# Patient Record
Sex: Female | Born: 1960 | Race: Black or African American | Hispanic: No
Health system: Southern US, Community
[De-identification: ages and names within clinical notes are randomized; demographics above are authoritative.]

---

## 1998-06-18 ENCOUNTER — Ambulatory Visit (HOSPITAL_COMMUNITY): Admission: RE | Admit: 1998-06-18 | Discharge: 1998-06-18 | Payer: Self-pay | Admitting: Obstetrics and Gynecology

## 2001-06-20 ENCOUNTER — Other Ambulatory Visit: Admission: RE | Admit: 2001-06-20 | Discharge: 2001-06-20 | Payer: Self-pay | Admitting: Obstetrics and Gynecology

## 2002-07-18 ENCOUNTER — Other Ambulatory Visit: Admission: RE | Admit: 2002-07-18 | Discharge: 2002-07-18 | Payer: Self-pay | Admitting: Obstetrics and Gynecology

## 2002-08-15 ENCOUNTER — Encounter: Payer: Self-pay | Admitting: Obstetrics and Gynecology

## 2002-08-15 ENCOUNTER — Encounter: Admission: RE | Admit: 2002-08-15 | Discharge: 2002-08-15 | Payer: Self-pay | Admitting: Obstetrics and Gynecology

## 2003-03-04 ENCOUNTER — Emergency Department (HOSPITAL_COMMUNITY): Admission: EM | Admit: 2003-03-04 | Discharge: 2003-03-04 | Payer: Self-pay | Admitting: *Deleted

## 2003-07-21 ENCOUNTER — Other Ambulatory Visit: Admission: RE | Admit: 2003-07-21 | Discharge: 2003-07-21 | Payer: Self-pay | Admitting: Obstetrics and Gynecology

## 2004-04-16 ENCOUNTER — Encounter: Admission: RE | Admit: 2004-04-16 | Discharge: 2004-04-16 | Payer: Self-pay | Admitting: Obstetrics and Gynecology

## 2005-05-04 ENCOUNTER — Encounter: Admission: RE | Admit: 2005-05-04 | Discharge: 2005-05-04 | Payer: Self-pay | Admitting: Obstetrics and Gynecology

## 2006-05-18 ENCOUNTER — Encounter (INDEPENDENT_AMBULATORY_CARE_PROVIDER_SITE_OTHER): Payer: Self-pay | Admitting: Specialist

## 2006-05-18 ENCOUNTER — Ambulatory Visit (HOSPITAL_COMMUNITY): Admission: RE | Admit: 2006-05-18 | Discharge: 2006-05-18 | Payer: Self-pay | Admitting: Obstetrics and Gynecology

## 2006-06-14 ENCOUNTER — Encounter: Admission: RE | Admit: 2006-06-14 | Discharge: 2006-06-14 | Payer: Self-pay | Admitting: Obstetrics and Gynecology

## 2006-06-21 ENCOUNTER — Encounter: Admission: RE | Admit: 2006-06-21 | Discharge: 2006-06-21 | Payer: Self-pay | Admitting: Obstetrics and Gynecology

## 2006-11-30 ENCOUNTER — Encounter: Admission: RE | Admit: 2006-11-30 | Discharge: 2006-11-30 | Payer: Self-pay | Admitting: Obstetrics and Gynecology

## 2008-02-28 ENCOUNTER — Other Ambulatory Visit: Admission: RE | Admit: 2008-02-28 | Discharge: 2008-02-28 | Payer: Self-pay | Admitting: Family Medicine

## 2008-07-16 ENCOUNTER — Encounter: Admission: RE | Admit: 2008-07-16 | Discharge: 2008-07-16 | Payer: Self-pay | Admitting: Family Medicine

## 2008-12-25 ENCOUNTER — Ambulatory Visit: Payer: Self-pay | Admitting: Gynecology

## 2009-03-06 ENCOUNTER — Other Ambulatory Visit: Admission: RE | Admit: 2009-03-06 | Discharge: 2009-03-06 | Payer: Self-pay | Admitting: Family Medicine

## 2010-01-07 ENCOUNTER — Encounter: Admission: RE | Admit: 2010-01-07 | Discharge: 2010-01-07 | Payer: Self-pay | Admitting: Family Medicine

## 2010-10-17 ENCOUNTER — Encounter: Payer: Self-pay | Admitting: Family Medicine

## 2010-10-17 ENCOUNTER — Encounter: Payer: Self-pay | Admitting: Obstetrics and Gynecology

## 2011-02-11 NOTE — Op Note (Signed)
Chelsea Wong, GOODCHILD             ACCOUNT NO.:  [id number]   MEDICAL RECORD NO.:  [id number]          PATIENT TYPE:  AMB   LOCATION:  SDC                           FACILITY:  [facility]   PHYSICIAN:  Lenoard Aden, M.D.DATE OF BIRTH:  1961/06/10   DATE OF PROCEDURE:  05/18/2006  DATE OF DISCHARGE:                                 OPERATIVE REPORT   PREOPERATIVE DIAGNOSIS:  Dysfunctional uterine bleeding with submucous  fibroid.   POSTOPERATIVE DIAGNOSIS:  Dysfunctional uterine bleeding with submucous  fibroid.   PROCEDURES:  1. Diagnostic hysteroscopy.  2. Resectoscopic myomectomy.  3. D&C.   SURGEON:  Dr. Billy Coast.   ANESTHESIA:  General.   ESTIMATED BLOOD LOSS:  Less than 50 cc.   FLUID DEFICIT:  300 cc.   COMPLICATIONS:  None.   DRAINS:  None.   COUNTS:  Correct.   Patient recovered in good condition.   BRIEF OPERATIVE NOTE:  After being apprised of the risks of anesthesia,  infection, bleeding, injury to bowel and organs, need for repair, uterine  perforation with possible need for repair, the patient was brought to the  operating room where she was administered anesthetic without complications,  prepped and draped in the usual sterile fashion, catheterized until the  bladder was emptied.  Examination under anesthesia reveals an anteflexed  uterus and no adnexal masses.  Dilute Pitressin and Nesacaine solutions are  placed in standard fashion.  At this time the cervix is easily dilated up to  a #27 Pratt dilator, hysteroscope placed.  Visualization reveals anterior  wall fibroid which was  resected in multiple passes using a single loop.  Good hemostasis was noted.  D&C is performed.  Posterior wall resection and left lateral wall resection  are performed without difficulty.  Good hemostasis is noted.  Instruments  are removed.  Pictures are taken.  Patient tolerated the procedure well and  was transferred to recovery in good condition.      Lenoard Aden,  M.D.  Electronically Signed     RJT/MEDQ  D:  05/18/2006  T:  05/18/2006  Job:  [id number]   cc:   Lenoard Aden, M.D.  Fax: [phone]

## 2011-02-14 ENCOUNTER — Other Ambulatory Visit: Payer: Self-pay | Admitting: Family Medicine

## 2011-02-14 DIAGNOSIS — Z1231 Encounter for screening mammogram for malignant neoplasm of breast: Secondary | ICD-10-CM

## 2011-02-23 ENCOUNTER — Ambulatory Visit: Payer: Self-pay

## 2011-03-01 ENCOUNTER — Ambulatory Visit
Admission: RE | Admit: 2011-03-01 | Discharge: 2011-03-01 | Disposition: A | Discharge status: Home / Self Care | Payer: 59 | Source: Ambulatory Visit | Attending: Family Medicine | Admitting: Family Medicine

## 2011-03-01 DIAGNOSIS — Z1231 Encounter for screening mammogram for malignant neoplasm of breast: Secondary | ICD-10-CM

## 2012-02-21 ENCOUNTER — Other Ambulatory Visit: Payer: Self-pay | Admitting: Family Medicine

## 2012-02-21 DIAGNOSIS — Z1231 Encounter for screening mammogram for malignant neoplasm of breast: Secondary | ICD-10-CM

## 2012-03-02 ENCOUNTER — Ambulatory Visit
Admission: RE | Admit: 2012-03-02 | Discharge: 2012-03-02 | Disposition: A | Discharge status: Home / Self Care | Payer: 59 | Source: Ambulatory Visit | Attending: Family Medicine | Admitting: Family Medicine

## 2012-03-02 ENCOUNTER — Ambulatory Visit: Payer: 59

## 2012-03-02 DIAGNOSIS — Z1231 Encounter for screening mammogram for malignant neoplasm of breast: Secondary | ICD-10-CM

## 2012-05-04 ENCOUNTER — Other Ambulatory Visit (HOSPITAL_COMMUNITY)
Admission: RE | Admit: 2012-05-04 | Discharge: 2012-05-04 | Disposition: A | Discharge status: Home / Self Care | Payer: 59 | Source: Ambulatory Visit | Attending: Family Medicine | Admitting: Family Medicine

## 2012-05-04 ENCOUNTER — Other Ambulatory Visit: Payer: Self-pay | Admitting: Physician Assistant

## 2012-05-04 DIAGNOSIS — Z124 Encounter for screening for malignant neoplasm of cervix: Secondary | ICD-10-CM | POA: Insufficient documentation

## 2013-02-21 ENCOUNTER — Other Ambulatory Visit: Payer: Self-pay

## 2013-02-21 DIAGNOSIS — Z1231 Encounter for screening mammogram for malignant neoplasm of breast: Secondary | ICD-10-CM

## 2013-03-19 ENCOUNTER — Ambulatory Visit
Admission: RE | Admit: 2013-03-19 | Discharge: 2013-03-19 | Disposition: A | Discharge status: Home / Self Care | Payer: 59 | Source: Ambulatory Visit

## 2013-03-19 DIAGNOSIS — Z1231 Encounter for screening mammogram for malignant neoplasm of breast: Secondary | ICD-10-CM

## 2013-05-10 ENCOUNTER — Other Ambulatory Visit: Payer: Self-pay | Admitting: Physician Assistant

## 2013-05-10 ENCOUNTER — Other Ambulatory Visit (HOSPITAL_COMMUNITY)
Admission: RE | Admit: 2013-05-10 | Discharge: 2013-05-10 | Disposition: A | Discharge status: Home / Self Care | Payer: 59 | Source: Ambulatory Visit | Attending: Family Medicine | Admitting: Family Medicine

## 2013-05-10 DIAGNOSIS — Z01419 Encounter for gynecological examination (general) (routine) without abnormal findings: Secondary | ICD-10-CM | POA: Insufficient documentation

## 2013-06-07 ENCOUNTER — Other Ambulatory Visit: Payer: Self-pay | Admitting: Gastroenterology

## 2014-02-28 ENCOUNTER — Other Ambulatory Visit: Payer: Self-pay

## 2014-02-28 DIAGNOSIS — Z1231 Encounter for screening mammogram for malignant neoplasm of breast: Secondary | ICD-10-CM

## 2014-04-01 ENCOUNTER — Ambulatory Visit
Admission: RE | Admit: 2014-04-01 | Discharge: 2014-04-01 | Disposition: A | Discharge status: Home / Self Care | Payer: 59 | Source: Ambulatory Visit

## 2014-04-01 ENCOUNTER — Encounter (INDEPENDENT_AMBULATORY_CARE_PROVIDER_SITE_OTHER): Payer: Self-pay

## 2014-04-01 DIAGNOSIS — Z1231 Encounter for screening mammogram for malignant neoplasm of breast: Secondary | ICD-10-CM

## 2014-04-03 ENCOUNTER — Other Ambulatory Visit: Payer: Self-pay | Admitting: Family Medicine

## 2014-04-03 DIAGNOSIS — R928 Other abnormal and inconclusive findings on diagnostic imaging of breast: Secondary | ICD-10-CM

## 2014-04-11 ENCOUNTER — Ambulatory Visit
Admission: RE | Admit: 2014-04-11 | Discharge: 2014-04-11 | Disposition: A | Discharge status: Home / Self Care | Payer: 59 | Source: Ambulatory Visit | Attending: Family Medicine | Admitting: Family Medicine

## 2014-04-11 DIAGNOSIS — R928 Other abnormal and inconclusive findings on diagnostic imaging of breast: Secondary | ICD-10-CM

## 2014-11-05 ENCOUNTER — Other Ambulatory Visit (HOSPITAL_COMMUNITY)
Admission: RE | Admit: 2014-11-05 | Discharge: 2014-11-05 | Disposition: A | Discharge status: Home / Self Care | Payer: 59 | Source: Ambulatory Visit | Attending: Family Medicine | Admitting: Family Medicine

## 2014-11-05 ENCOUNTER — Other Ambulatory Visit: Payer: Self-pay | Admitting: Physician Assistant

## 2014-11-05 DIAGNOSIS — Z124 Encounter for screening for malignant neoplasm of cervix: Secondary | ICD-10-CM | POA: Diagnosis present

## 2014-11-07 LAB — CYTOLOGY - PAP

## 2016-04-26 ENCOUNTER — Other Ambulatory Visit: Payer: Self-pay | Admitting: Physician Assistant

## 2016-04-26 ENCOUNTER — Ambulatory Visit
Admission: RE | Admit: 2016-04-26 | Discharge: 2016-04-26 | Disposition: A | Discharge status: Home / Self Care | Payer: 59 | Source: Ambulatory Visit | Attending: Physician Assistant | Admitting: Physician Assistant

## 2016-04-26 DIAGNOSIS — R52 Pain, unspecified: Secondary | ICD-10-CM

## 2016-11-09 ENCOUNTER — Other Ambulatory Visit: Payer: Self-pay | Admitting: Physician Assistant

## 2016-11-09 DIAGNOSIS — R921 Mammographic calcification found on diagnostic imaging of breast: Secondary | ICD-10-CM

## 2016-11-21 ENCOUNTER — Ambulatory Visit
Admission: RE | Admit: 2016-11-21 | Discharge: 2016-11-21 | Disposition: A | Discharge status: Home / Self Care | Payer: 59 | Source: Ambulatory Visit | Attending: Physician Assistant | Admitting: Physician Assistant

## 2016-11-21 ENCOUNTER — Encounter: Payer: Self-pay | Admitting: Radiology

## 2016-11-21 ENCOUNTER — Other Ambulatory Visit: Payer: Self-pay | Admitting: Physician Assistant

## 2016-11-21 DIAGNOSIS — R921 Mammographic calcification found on diagnostic imaging of breast: Secondary | ICD-10-CM

## 2016-11-22 ENCOUNTER — Ambulatory Visit
Admission: RE | Admit: 2016-11-22 | Discharge: 2016-11-22 | Disposition: A | Discharge status: Home / Self Care | Payer: 59 | Source: Ambulatory Visit | Attending: Physician Assistant | Admitting: Physician Assistant

## 2016-11-22 DIAGNOSIS — R921 Mammographic calcification found on diagnostic imaging of breast: Secondary | ICD-10-CM

## 2017-10-13 ENCOUNTER — Other Ambulatory Visit: Payer: Self-pay | Admitting: Physician Assistant

## 2017-10-13 DIAGNOSIS — Z1231 Encounter for screening mammogram for malignant neoplasm of breast: Secondary | ICD-10-CM

## 2017-11-10 ENCOUNTER — Other Ambulatory Visit (HOSPITAL_COMMUNITY)
Admission: RE | Admit: 2017-11-10 | Discharge: 2017-11-10 | Disposition: A | Discharge status: Home / Self Care | Payer: 59 | Source: Ambulatory Visit | Attending: Family Medicine | Admitting: Family Medicine

## 2017-11-10 ENCOUNTER — Other Ambulatory Visit: Payer: Self-pay | Admitting: Physician Assistant

## 2017-11-10 DIAGNOSIS — Z01419 Encounter for gynecological examination (general) (routine) without abnormal findings: Secondary | ICD-10-CM | POA: Diagnosis present

## 2017-11-14 LAB — CYTOLOGY - PAP: DIAGNOSIS: NEGATIVE

## 2017-11-24 ENCOUNTER — Ambulatory Visit
Admission: RE | Admit: 2017-11-24 | Discharge: 2017-11-24 | Disposition: A | Discharge status: Home / Self Care | Payer: 59 | Source: Ambulatory Visit | Attending: Physician Assistant | Admitting: Physician Assistant

## 2017-11-24 DIAGNOSIS — Z1231 Encounter for screening mammogram for malignant neoplasm of breast: Secondary | ICD-10-CM

## 2019-07-11 ENCOUNTER — Emergency Department (HOSPITAL_COMMUNITY)
Admission: EM | Admit: 2019-07-11 | Discharge: 2019-07-11 | Disposition: A | Discharge status: Home / Self Care | Payer: 59 | Attending: Emergency Medicine | Admitting: Emergency Medicine

## 2019-07-11 ENCOUNTER — Other Ambulatory Visit: Payer: Self-pay

## 2019-07-11 ENCOUNTER — Emergency Department (HOSPITAL_COMMUNITY): Payer: 59

## 2019-07-11 DIAGNOSIS — R0602 Shortness of breath: Secondary | ICD-10-CM | POA: Diagnosis present

## 2019-07-11 DIAGNOSIS — U071 COVID-19: Secondary | ICD-10-CM | POA: Diagnosis not present

## 2019-07-11 DIAGNOSIS — Z20822 Contact with and (suspected) exposure to covid-19: Secondary | ICD-10-CM

## 2019-07-11 LAB — CBC WITH DIFFERENTIAL/PLATELET
Abs Immature Granulocytes: 0.02 K/uL (ref 0.00–0.07)
Basophils Absolute: 0 K/uL (ref 0.0–0.1)
Basophils Relative: 0 %
Eosinophils Absolute: 0 K/uL (ref 0.0–0.5)
Eosinophils Relative: 0 %
HCT: 44 % (ref 36.0–46.0)
Hemoglobin: 14.6 g/dL (ref 12.0–15.0)
Immature Granulocytes: 0 %
Lymphocytes Relative: 24 %
Lymphs Abs: 1.2 K/uL (ref 0.7–4.0)
MCH: 28.5 pg (ref 26.0–34.0)
MCHC: 33.2 g/dL (ref 30.0–36.0)
MCV: 85.9 fL (ref 80.0–100.0)
Monocytes Absolute: 0.4 K/uL (ref 0.1–1.0)
Monocytes Relative: 9 %
Neutro Abs: 3.4 K/uL (ref 1.7–7.7)
Neutrophils Relative %: 67 %
Platelets: 121 K/uL — ABNORMAL LOW (ref 150–400)
RBC: 5.12 MIL/uL — ABNORMAL HIGH (ref 3.87–5.11)
RDW: 14.2 % (ref 11.5–15.5)
WBC: 5.1 K/uL (ref 4.0–10.5)
nRBC: 0 % (ref 0.0–0.2)

## 2019-07-11 LAB — COMPREHENSIVE METABOLIC PANEL
ALT: 25 U/L (ref 0–44)
AST: 58 U/L — ABNORMAL HIGH (ref 15–41)
Albumin: 3.7 g/dL (ref 3.5–5.0)
Alkaline Phosphatase: 70 U/L (ref 38–126)
Anion gap: 13 (ref 5–15)
BUN: 13 mg/dL (ref 6–20)
CO2: 22 mmol/L (ref 22–32)
Calcium: 8.5 mg/dL — ABNORMAL LOW (ref 8.9–10.3)
Chloride: 101 mmol/L (ref 98–111)
Creatinine, Ser: 0.95 mg/dL (ref 0.44–1.00)
GFR calc Af Amer: >60 mL/min (ref >60)
GFR calc non Af Amer: >60 mL/min (ref >60)
Glucose, Bld: 111 mg/dL — ABNORMAL HIGH (ref 70–99)
Potassium: 4.8 mmol/L (ref 3.5–5.1)
Sodium: 136 mmol/L (ref 135–145)
Total Bilirubin: 1.9 mg/dL — ABNORMAL HIGH (ref 0.3–1.2)
Total Protein: 7.2 g/dL (ref 6.5–8.1)

## 2019-07-11 LAB — LIPASE, BLOOD: Lipase: 34 U/L (ref 11–51)

## 2019-07-11 MED ORDER — FAMOTIDINE IN NACL 20-0.9 MG/50ML-% IV SOLN
20.0000 mg | Freq: Once | INTRAVENOUS | Status: AC
  Administered 2019-07-11: 20 mg via INTRAVENOUS
  Filled 2019-07-11: qty 50

## 2019-07-11 MED ORDER — ONDANSETRON HCL 4 MG PO TABS: 4.0000 mg | ORAL_TABLET | Freq: Four times a day (QID) | ORAL | 0 refills | Status: AC

## 2019-07-11 MED ORDER — ACETAMINOPHEN 500 MG PO TABS
1000.0000 mg | ORAL_TABLET | Freq: Once | ORAL | Status: AC
  Administered 2019-07-11: 12:00:00 1000 mg via ORAL
  Filled 2019-07-11: qty 2

## 2019-07-11 MED ORDER — SODIUM CHLORIDE 0.9 % IV BOLUS
1000.0000 mL | Freq: Once | INTRAVENOUS | Status: AC
  Administered 2019-07-11: 1000 mL via INTRAVENOUS

## 2019-07-11 MED ORDER — ALUM & MAG HYDROXIDE-SIMETH 200-200-20 MG/5ML PO SUSP
30.0000 mL | Freq: Once | ORAL | Status: AC
  Administered 2019-07-11: 30 mL via ORAL
  Filled 2019-07-11: qty 30

## 2019-07-11 MED ORDER — METOCLOPRAMIDE HCL 5 MG/ML IJ SOLN
10.0000 mg | Freq: Once | INTRAMUSCULAR | Status: AC
  Administered 2019-07-11: 10 mg via INTRAVENOUS
  Filled 2019-07-11: qty 2

## 2019-07-11 NOTE — Discharge Instructions (Addendum)
You should rotate tylenol and motrin to treat your fevers and body aches. Make sure to stay well hydrated. You were given a prescription for zofran. Please take as directed.   Today you were were tested for the coronavirus.  The results will be available in the next 3 to 5 days.  If the results are positive the hospital will contact you.  If they are negative the hospital would not contact you.  You will need to self quarantine until you are aware of your results.  If they are positive you will need to self quarantine as directed below.  You should be isolated for at least 7 days since the onset of your symptoms AND >72 hours after symptoms resolution (absence of fever without the use of fever reducing medication and improvement in respiratory symptoms), whichever is longer  Please follow up with your primary care provider within 5-7 days for re-evaluation of your symptoms. If you do not have a primary care provider, information for a healthcare clinic has been provided for you to make arrangements for follow up care. Please return to the emergency department for any new or worsening symptoms.

## 2019-07-11 NOTE — ED Notes (Signed)
Pt given sprite and crackers per fluid challenge order. Will monitor for nausea and vomiting.

## 2019-07-11 NOTE — ED Triage Notes (Signed)
PER EMS: pt presents with c/o SOB, fever, chills, N/V/D, loss of taste and smell onset 6 days ago. She reports possible covid exposure. A&OX4. Initial RA sats with EMS was 96% but placed on 2L [place]. BP- 142/98, HR-88, 97% RA, RR-20, CBG-116

## 2019-07-11 NOTE — ED Notes (Signed)
Pt A&Ox4, ambulatory at d/c with independent steady gait, NAD. Pt verbalized understanding of d/c instructions and follow up care. Pt reports her husband will be driving her home.

## 2019-07-11 NOTE — ED Provider Notes (Addendum)
[facility] EMERGENCY DEPARTMENT Provider Note   CSN: [id number] Arrival date & time: 07/11/19  1107     History   Chief Complaint Chief Complaint  Patient presents with  . Shortness of Breath  . Covid R/O    HPI AKILAH CURETON is a 58 y.o. female.     HPI   Patient is a 58 year old female with no known past medical history presents to the emergency department today for evaluation of fevers, chills, nausea, vomiting, diarrhea, loss of taste/smell and fatigue for the last 6 days.  She has had no real cough.  She has been getting winded when she tries to walk around.  She denies any significant chest pain.  Denies any exacerbating or alleviating factors.  She is concerned that she has COVID as she was recently around her sister-in-law whose children both tested positive for the coronavirus.  Her test is currently pending at this time but she is now also symptomatic.  No past medical history on file.  There are no active problems to display for this patient.   No past surgical history on file.   OB History   No obstetric history on file.      Home Medications    Prior to Admission medications   Medication Sig Start Date End Date Taking? Authorizing Provider  ondansetron (ZOFRAN) 4 MG tablet Take 1 tablet (4 mg total) by mouth every 6 (six) hours. 07/11/19   Zaivion Kundrat S, PA-C    Family History Family History  Problem Relation Age of Onset  . Breast cancer Sister 40  . Breast cancer Sister 54    Social History Social History   Tobacco Use  . Smoking status: Not on file  Substance Use Topics  . Alcohol use: Not on file  . Drug use: Not on file     Allergies   Patient has no known allergies.   Review of Systems Review of Systems  Constitutional: Positive for chills, fatigue and fever.       Loss of taste/smell  HENT: Negative for ear pain and sore throat.   Eyes: Negative for pain and visual disturbance.  Respiratory:  Positive for shortness of breath. Negative for cough.   Cardiovascular: Negative for chest pain.  Gastrointestinal: Positive for diarrhea, nausea and vomiting. Negative for abdominal pain.  Genitourinary: Negative for dysuria and hematuria.  Musculoskeletal: Negative for back pain.  Skin: Negative for rash.  Neurological: Negative for headaches.  All other systems reviewed and are negative.    Physical Exam Updated Vital Signs BP (!) 153/84 (BP Location: Right Arm)   Pulse 81   Temp 99.9 F (37.7 C) (Oral)   Resp 18   Ht 5\' 7"  (1.702 m)   Wt 113.4 kg   LMP 02/28/2011   SpO2 97%   BMI 39.16 kg/m   Physical Exam Vitals signs and nursing note reviewed.  Constitutional:      General: She is not in acute distress.    Appearance: She is well-developed. She is not ill-appearing or toxic-appearing.  HENT:     Head: Normocephalic and atraumatic.  Eyes:     Conjunctiva/sclera: Conjunctivae normal.  Neck:     Musculoskeletal: Neck supple.  Cardiovascular:     Rate and Rhythm: Normal rate and regular rhythm.     Pulses: Normal pulses.     Heart sounds: Normal heart sounds. No murmur.  Pulmonary:     Effort: Pulmonary effort is normal. No respiratory distress.  Breath sounds: Normal breath sounds. No decreased breath sounds, wheezing, rhonchi or rales.  Abdominal:     General: Bowel sounds are normal.     Palpations: Abdomen is soft.     Tenderness: There is no abdominal tenderness. There is no guarding or rebound.  Musculoskeletal:     Right lower leg: She exhibits no tenderness. No edema.     Left lower leg: She exhibits no tenderness. No edema.  Skin:    General: Skin is warm and dry.  Neurological:     Mental Status: She is alert.      ED Treatments / Results  Labs (all labs ordered are listed, but only abnormal results are displayed) Labs Reviewed  CBC WITH DIFFERENTIAL/PLATELET - Abnormal; Notable for the following components:      Result Value   RBC 5.12  (*)    Platelets 121 (*)    All other components within normal limits  COMPREHENSIVE METABOLIC PANEL - Abnormal; Notable for the following components:   Glucose, Bld 111 (*)    Calcium 8.5 (*)    AST 58 (*)    Total Bilirubin 1.9 (*)    All other components within normal limits  NOVEL CORONAVIRUS, NAA (HOSP ORDER, SEND-OUT TO REF LAB; TAT 18-24 HRS)  LIPASE, BLOOD    EKG None  Radiology Dg Chest Portable 1 View  Result Date: 07/11/2019 CLINICAL DATA:  Shortness of breath and fever EXAM: PORTABLE CHEST 1 VIEW COMPARISON:  None. FINDINGS: Lungs are clear. There is cardiomegaly with pulmonary vascularity normal. No adenopathy. No bone lesions. IMPRESSION: Cardiomegaly.  No edema or consolidation. Electronically Signed   By: Bretta BangWilliam  Woodruff III M.D.   On: 07/11/2019 12:08    Procedures Procedures (including critical care time)  Medications Ordered in ED Medications  sodium chloride 0.9 % bolus 1,000 mL (0 mLs Intravenous Stopped 07/11/19 1311)  acetaminophen (TYLENOL) tablet 1,000 mg (1,000 mg Oral Given 07/11/19 1131)  metoCLOPramide (REGLAN) injection 10 mg (10 mg Intravenous Given 07/11/19 1141)  famotidine (PEPCID) IVPB 20 mg premix (0 mg Intravenous Stopped 07/11/19 1213)  alum & mag hydroxide-simeth (MAALOX/MYLANTA) 200-200-20 MG/5ML suspension 30 mL (30 mLs Oral Given 07/11/19 1144)     Initial Impression / Assessment and Plan / ED Course  I have reviewed the triage vital signs and the nursing notes.  Pertinent labs & imaging results that were available during my care of the patient were reviewed by me and considered in my medical decision making (see chart for details).   Final Clinical Impressions(s) / ED Diagnoses   Final diagnoses:  Suspected COVID-19 virus infection   Patient is a 58 year old female with no known past medical history presents to the emergency department today for evaluation of fevers, chills, nausea, vomiting, diarrhea, loss of taste/smell and  fatigue for the last 6 days.  She has had no real cough.  She has been getting winded when she tries to walk around.  She denies any significant chest pain.  Denies any exacerbating or alleviating factors.  She is concerned that she has COVID as she was recently around her sister-in-law whose children both tested positive for the coronavirus.  Her test is currently pending at this time but she is now also symptomatic.  Pt febrile on arrival but otherwise vs reassuring. She is nontoxic appearing but does appear somewhat dehydrated. Exam otherwise reassuring.   Labs reviewed. No leukocytosis, anemia. No gross electrolyte derangement. Normal renal function. Mild elevation of AST suspect 2/2 covid infection. Lipase  negative.  CXR clear.   Pt able to tolerate po. I suspect sxs secondary to covid. Will send testing. Discussed findings and plan for d/c with symptomatic mangement. Advised on quarantine measures. Advised on specific return precautions. She voices understanding and is in agreement with plan. All questions answered, pt stable for d.c   ED Discharge Orders         Ordered    ondansetron (ZOFRAN) 4 MG tablet  Every 6 hours     07/11/19 [place], Renso Swett S, PA-C 07/11/19 1313    LongWonda Olds, MD 07/11/19 1957    Rodney Booze, PA-C 07/22/19 1412    Long, Wonda Olds, MD 07/23/19 1055

## 2019-07-11 NOTE — ED Notes (Signed)
PO challenge successful.

## 2019-07-15 LAB — NOVEL CORONAVIRUS, NAA (HOSP ORDER, SEND-OUT TO REF LAB; TAT 18-24 HRS): SARS-CoV-2, NAA: DETECTED — AB

## 2020-05-20 ENCOUNTER — Other Ambulatory Visit: Payer: Self-pay | Admitting: Physician Assistant

## 2020-05-20 ENCOUNTER — Ambulatory Visit
Admission: RE | Admit: 2020-05-20 | Discharge: 2020-05-20 | Disposition: A | Discharge status: Home / Self Care | Payer: 59 | Source: Ambulatory Visit | Attending: Physician Assistant | Admitting: Physician Assistant

## 2020-05-20 DIAGNOSIS — M542 Cervicalgia: Secondary | ICD-10-CM

## 2020-05-20 DIAGNOSIS — M5489 Other dorsalgia: Secondary | ICD-10-CM

## 2020-06-29 ENCOUNTER — Other Ambulatory Visit: Payer: Self-pay | Admitting: Physician Assistant

## 2020-06-29 DIAGNOSIS — Z1231 Encounter for screening mammogram for malignant neoplasm of breast: Secondary | ICD-10-CM

## 2020-07-22 ENCOUNTER — Ambulatory Visit
Admission: RE | Admit: 2020-07-22 | Discharge: 2020-07-22 | Disposition: A | Discharge status: Home / Self Care | Payer: 59 | Source: Ambulatory Visit | Attending: Physician Assistant | Admitting: Physician Assistant

## 2020-07-22 ENCOUNTER — Other Ambulatory Visit: Payer: Self-pay

## 2020-07-22 DIAGNOSIS — Z1231 Encounter for screening mammogram for malignant neoplasm of breast: Secondary | ICD-10-CM
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
## 2021-11-22 ENCOUNTER — Other Ambulatory Visit: Payer: Self-pay | Admitting: Physician Assistant

## 2021-11-22 DIAGNOSIS — Z1231 Encounter for screening mammogram for malignant neoplasm of breast: Secondary | ICD-10-CM

## 2021-12-02 ENCOUNTER — Ambulatory Visit
Admission: RE | Admit: 2021-12-02 | Discharge: 2021-12-02 | Disposition: A | Discharge status: Home / Self Care | Payer: 59 | Source: Ambulatory Visit | Attending: Physician Assistant | Admitting: Physician Assistant

## 2021-12-02 ENCOUNTER — Other Ambulatory Visit: Payer: Self-pay

## 2021-12-02 DIAGNOSIS — Z1231 Encounter for screening mammogram for malignant neoplasm of breast: Secondary | ICD-10-CM

## 2022-11-29 ENCOUNTER — Other Ambulatory Visit: Payer: Self-pay | Admitting: Physician Assistant

## 2022-11-29 DIAGNOSIS — Z1231 Encounter for screening mammogram for malignant neoplasm of breast: Secondary | ICD-10-CM

## 2023-01-11 ENCOUNTER — Ambulatory Visit
Admission: RE | Admit: 2023-01-11 | Discharge: 2023-01-11 | Disposition: A | Discharge status: Home / Self Care | Payer: 59 | Source: Ambulatory Visit | Attending: Physician Assistant | Admitting: Physician Assistant

## 2023-01-11 DIAGNOSIS — Z1231 Encounter for screening mammogram for malignant neoplasm of breast: Secondary | ICD-10-CM

## 2023-12-06 ENCOUNTER — Other Ambulatory Visit: Payer: Self-pay

## 2023-12-08 LAB — SURGICAL PATHOLOGY

## 2024-04-19 ENCOUNTER — Other Ambulatory Visit: Payer: Self-pay | Admitting: Physician Assistant

## 2024-04-19 DIAGNOSIS — Z1231 Encounter for screening mammogram for malignant neoplasm of breast: Secondary | ICD-10-CM

## 2024-05-03 ENCOUNTER — Ambulatory Visit
Admission: RE | Admit: 2024-05-03 | Discharge: 2024-05-03 | Disposition: A | Discharge status: Home / Self Care | Source: Ambulatory Visit | Attending: Physician Assistant | Admitting: Physician Assistant

## 2024-05-03 DIAGNOSIS — Z1231 Encounter for screening mammogram for malignant neoplasm of breast: Secondary | ICD-10-CM
